# Patient Record
Sex: Female | Born: 2000 | Race: White | Hispanic: No | Marital: Single
Health system: Southern US, Community
[De-identification: ages and names within clinical notes are randomized; demographics above are authoritative.]

---
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
## 2022-04-03 ENCOUNTER — Other Ambulatory Visit: Payer: Self-pay | Admitting: Obstetrics & Gynecology

## 2022-04-03 DIAGNOSIS — N6320 Unspecified lump in the left breast, unspecified quadrant: Secondary | ICD-10-CM

## 2022-04-05 ENCOUNTER — Inpatient Hospital Stay: Admission: RE | Admit: 2022-04-05 | Payer: BC Managed Care – PPO | Source: Ambulatory Visit

## 2022-04-20 ENCOUNTER — Other Ambulatory Visit: Payer: Self-pay | Admitting: Obstetrics & Gynecology

## 2022-04-20 ENCOUNTER — Ambulatory Visit
Admission: RE | Admit: 2022-04-20 | Discharge: 2022-04-20 | Disposition: A | Discharge status: Home / Self Care | Payer: Self-pay | Source: Ambulatory Visit | Attending: Obstetrics & Gynecology | Admitting: Obstetrics & Gynecology

## 2022-04-20 DIAGNOSIS — N6320 Unspecified lump in the left breast, unspecified quadrant: Secondary | ICD-10-CM

## 2022-10-24 ENCOUNTER — Ambulatory Visit
Admission: RE | Admit: 2022-10-24 | Discharge: 2022-10-24 | Disposition: A | Discharge status: Home / Self Care | Payer: BC Managed Care – PPO | Source: Ambulatory Visit | Attending: Obstetrics & Gynecology | Admitting: Obstetrics & Gynecology

## 2022-10-24 ENCOUNTER — Other Ambulatory Visit: Payer: Self-pay | Admitting: Obstetrics & Gynecology

## 2022-10-24 DIAGNOSIS — N6320 Unspecified lump in the left breast, unspecified quadrant: Secondary | ICD-10-CM

## 2022-12-09 IMAGING — US US BREAST*L* LIMITED INC AXILLA
1 series · 5 of 5 positions shown · non-contrast
Comparison: None Available.

CLINICAL DATA: Mass felt by the patient in the upper inner left
breast for the proximally 3 months, without significant change in
size during that time. Previous right breast fibroadenoma resection.

EXAM:
ULTRASOUND OF THE LEFT BREAST

[Series 1: us breast*left* limited inc axilla · 0.06mm/px · 5 of 5 slices shown]
[im 1/5]
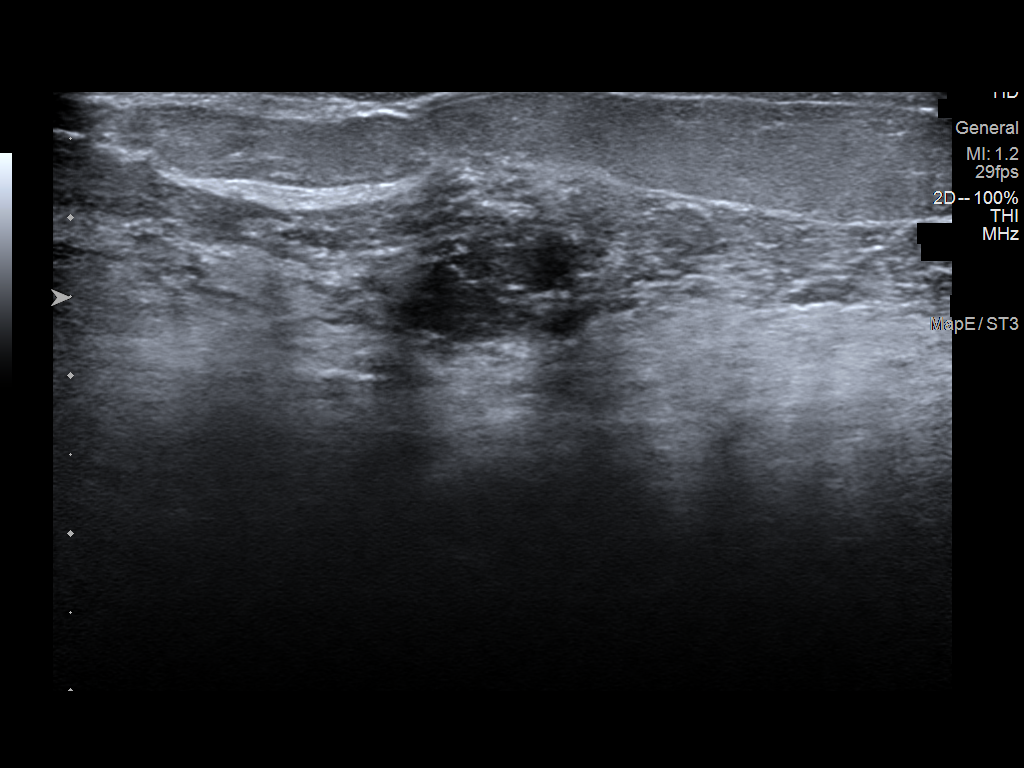
[im 2/5]
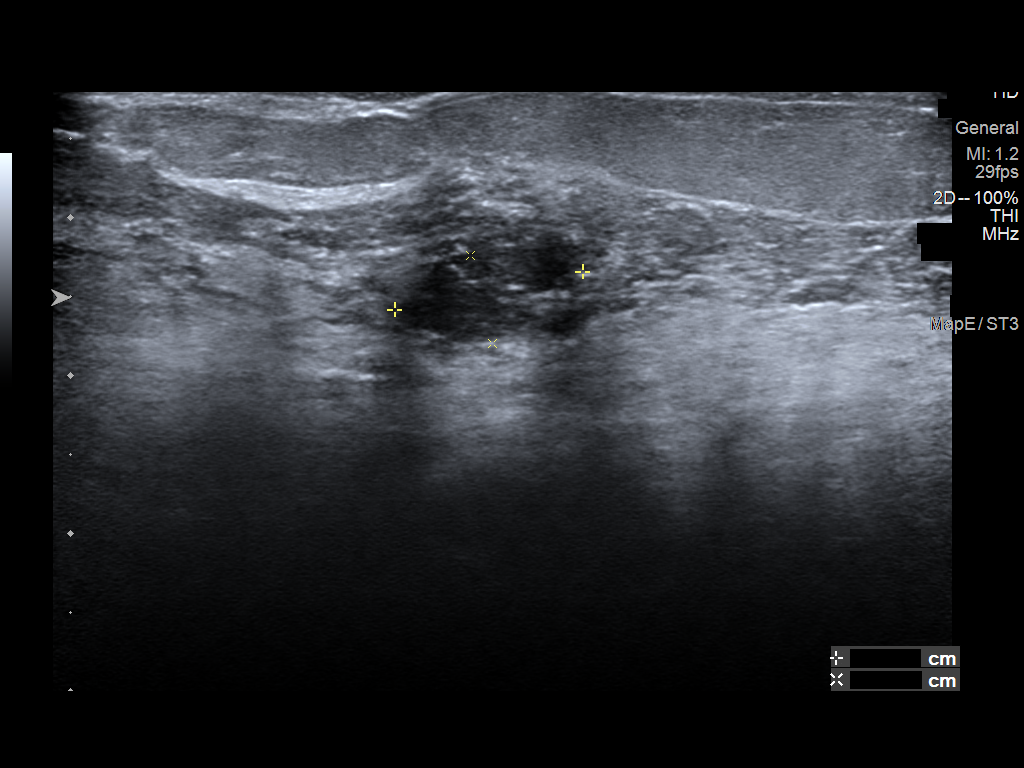
[im 3/5]
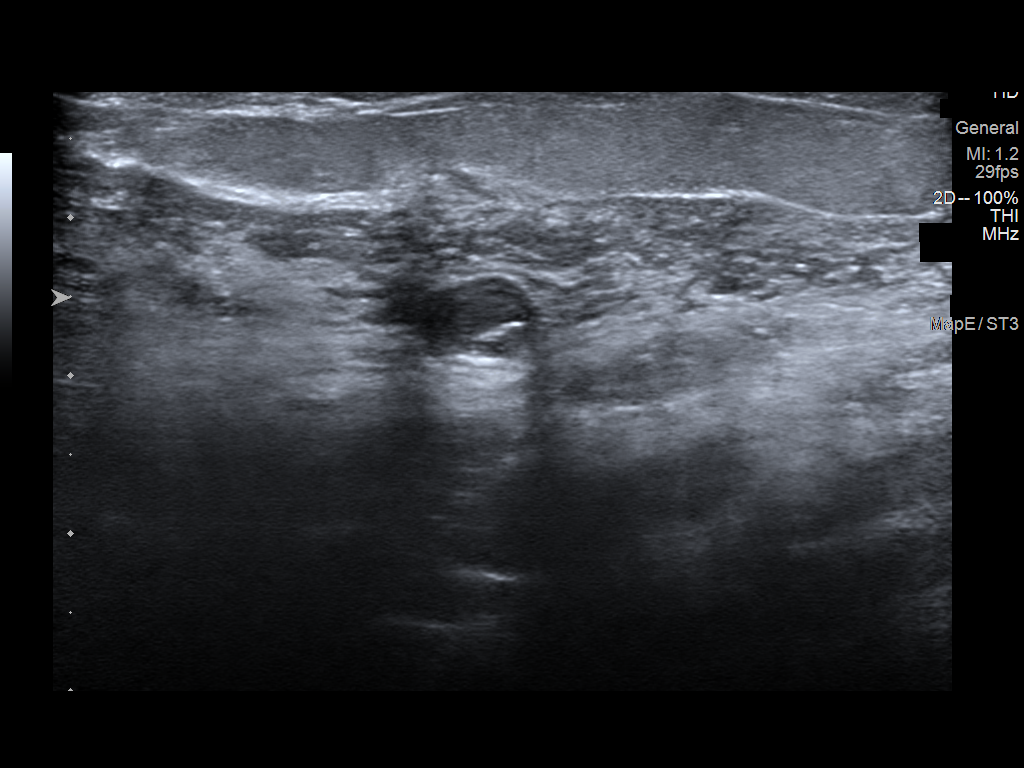
[im 4/5]
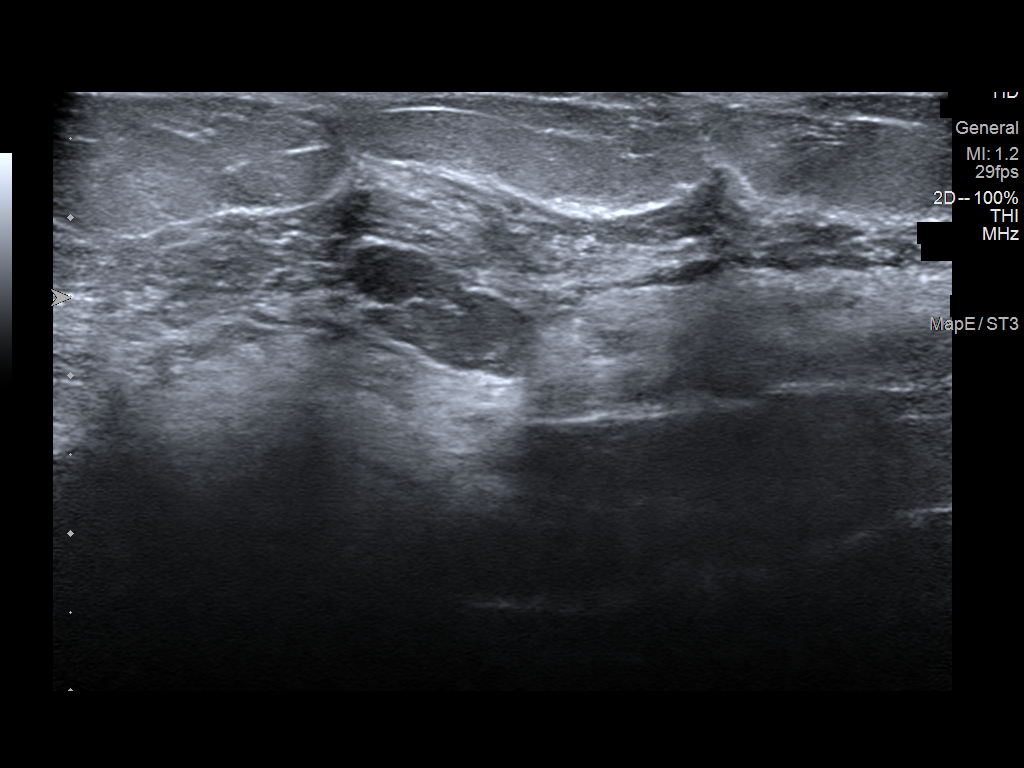
[im 5/5]
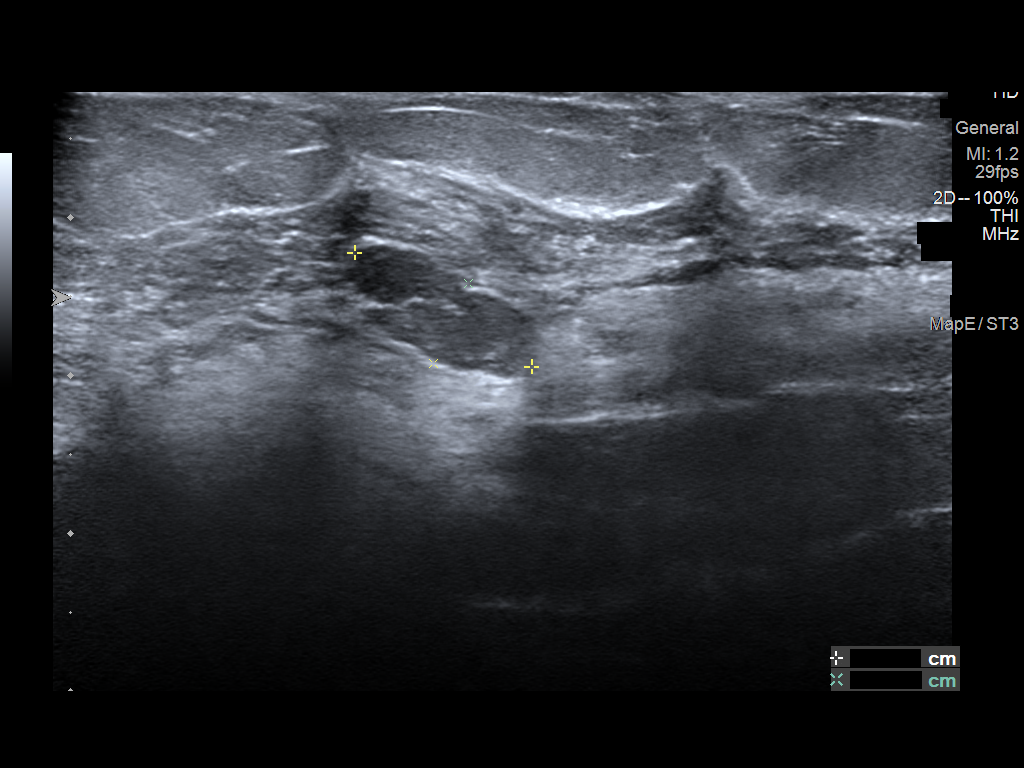

[5 of 5 positions shown; findings below may reference images not displayed]

FINDINGS: On physical exam, there is a small, faintly palpable mass in the
11:30 o'clock position of the left breast, 4 cm from the nipple.

Targeted ultrasound is performed, showing a 1.3 x 1.2 x 0.6 cm oval,
horizontally oriented, hypoechoic, circumscribed mass with thin
partial internal septations and increased through transmission of
sound in the 11:30 o'clock position of the left breast, 4 cm from
the nipple.
IMPRESSION: 1.3 cm left breast probable benign fibroadenoma.

RECOMMENDATION:
Left breast ultrasound in 6 months. The patient was instructed to
return sooner if the mass that she feels becomes significantly
larger to palpation.

I have discussed the findings and recommendations with the patient.
If applicable, a reminder letter will be sent to the patient
regarding the next appointment.

BI-RADS CATEGORY  3: Probably benign.

## 2023-04-26 ENCOUNTER — Ambulatory Visit
Admission: RE | Admit: 2023-04-26 | Discharge: 2023-04-26 | Disposition: A | Discharge status: Home / Self Care | Payer: BC Managed Care – PPO | Source: Ambulatory Visit | Attending: Obstetrics & Gynecology | Admitting: Obstetrics & Gynecology

## 2023-04-26 DIAGNOSIS — N6320 Unspecified lump in the left breast, unspecified quadrant: Secondary | ICD-10-CM

## 2023-05-03 ENCOUNTER — Other Ambulatory Visit: Payer: Self-pay | Admitting: Obstetrics & Gynecology

## 2023-05-03 DIAGNOSIS — N6320 Unspecified lump in the left breast, unspecified quadrant: Secondary | ICD-10-CM

## 2024-04-30 ENCOUNTER — Other Ambulatory Visit: Payer: Self-pay

## 2024-05-01 ENCOUNTER — Ambulatory Visit
Admission: RE | Admit: 2024-05-01 | Discharge: 2024-05-01 | Disposition: A | Discharge status: Home / Self Care | Payer: Self-pay | Source: Ambulatory Visit | Attending: Obstetrics & Gynecology | Admitting: Obstetrics & Gynecology

## 2024-05-01 DIAGNOSIS — N6320 Unspecified lump in the left breast, unspecified quadrant: Secondary | ICD-10-CM
